# Patient Record
Sex: Female | Born: 1990 | Race: Black or African American | Hispanic: No | Marital: Single
Health system: Southern US, Community
[De-identification: ages and names within clinical notes are randomized; demographics above are authoritative.]

## PROBLEM LIST (undated history)

## (undated) DIAGNOSIS — F32A Depression, unspecified: Secondary | ICD-10-CM

---
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
## 2020-05-18 ENCOUNTER — Emergency Department (HOSPITAL_COMMUNITY)
Admission: EM | Admit: 2020-05-18 | Discharge: 2020-05-18 | Disposition: A | Discharge status: Home / Self Care | Payer: Self-pay | Attending: Emergency Medicine | Admitting: Emergency Medicine

## 2020-05-18 ENCOUNTER — Encounter (HOSPITAL_COMMUNITY): Payer: Self-pay

## 2020-05-18 DIAGNOSIS — R258 Other abnormal involuntary movements: Secondary | ICD-10-CM | POA: Insufficient documentation

## 2020-05-18 DIAGNOSIS — R000 Tachycardia, unspecified: Secondary | ICD-10-CM | POA: Insufficient documentation

## 2020-05-18 DIAGNOSIS — F1010 Alcohol abuse, uncomplicated: Secondary | ICD-10-CM | POA: Insufficient documentation

## 2020-05-18 DIAGNOSIS — R45851 Suicidal ideations: Secondary | ICD-10-CM | POA: Insufficient documentation

## 2020-05-18 DIAGNOSIS — F1290 Cannabis use, unspecified, uncomplicated: Secondary | ICD-10-CM | POA: Insufficient documentation

## 2020-05-18 DIAGNOSIS — Z046 Encounter for general psychiatric examination, requested by authority: Secondary | ICD-10-CM

## 2020-05-18 HISTORY — DX: Depression, unspecified: F32.A

## 2020-05-18 LAB — COMPREHENSIVE METABOLIC PANEL
ALT: 15 U/L (ref 0–44)
AST: 22 U/L (ref 15–41)
Albumin: 4.7 g/dL (ref 3.5–5.0)
Alkaline Phosphatase: 58 U/L (ref 38–126)
Anion gap: 16 — ABNORMAL HIGH (ref 5–15)
BUN: 12 mg/dL (ref 6–20)
CO2: 19 mmol/L — ABNORMAL LOW (ref 22–32)
Calcium: 9.1 mg/dL (ref 8.9–10.3)
Chloride: 106 mmol/L (ref 98–111)
Creatinine, Ser: 0.75 mg/dL (ref 0.44–1.00)
GFR calc Af Amer: >60 mL/min (ref >60)
GFR calc non Af Amer: >60 mL/min (ref >60)
Glucose, Bld: 86 mg/dL (ref 70–99)
Potassium: 3.4 mmol/L — ABNORMAL LOW (ref 3.5–5.1)
Sodium: 141 mmol/L (ref 135–145)
Total Bilirubin: 0.4 mg/dL (ref 0.3–1.2)
Total Protein: 7.4 g/dL (ref 6.5–8.1)

## 2020-05-18 LAB — CBC
HCT: 42.9 % (ref 36.0–46.0)
Hemoglobin: 15.3 g/dL — ABNORMAL HIGH (ref 12.0–15.0)
MCH: 33.8 pg (ref 26.0–34.0)
MCHC: 35.7 g/dL (ref 30.0–36.0)
MCV: 94.9 fL (ref 80.0–100.0)
Platelets: 401 10*3/uL — ABNORMAL HIGH (ref 150–400)
RBC: 4.52 MIL/uL (ref 3.87–5.11)
RDW: 13.6 % (ref 11.5–15.5)
WBC: 8.8 10*3/uL (ref 4.0–10.5)
nRBC: 0 % (ref 0.0–0.2)

## 2020-05-18 LAB — RAPID URINE DRUG SCREEN, HOSP PERFORMED
Amphetamines: NOT DETECTED
Barbiturates: NOT DETECTED
Benzodiazepines: NOT DETECTED
Cocaine: NOT DETECTED
Opiates: NOT DETECTED
Tetrahydrocannabinol: POSITIVE — AB

## 2020-05-18 LAB — ACETAMINOPHEN LEVEL: Acetaminophen (Tylenol), Serum: <10 ug/mL — ABNORMAL LOW (ref 10–30)

## 2020-05-18 LAB — SALICYLATE LEVEL: Salicylate Lvl: <7 mg/dL — ABNORMAL LOW (ref 7.0–30.0)

## 2020-05-18 LAB — ETHANOL: Alcohol, Ethyl (B): 252 mg/dL — ABNORMAL HIGH (ref <10)

## 2020-05-18 LAB — HCG, QUANTITATIVE, PREGNANCY: hCG, Beta Chain, Quant, S: <1 m[IU]/mL (ref <5)

## 2020-05-18 MED ORDER — LORAZEPAM 1 MG PO TABS: 0.0000 mg | ORAL_TABLET | Freq: Two times a day (BID) | ORAL | Status: DC

## 2020-05-18 MED ORDER — LORAZEPAM 2 MG/ML IJ SOLN: 0.0000 mg | Freq: Four times a day (QID) | INTRAMUSCULAR | Status: DC

## 2020-05-18 MED ORDER — THIAMINE HCL 100 MG/ML IJ SOLN: 100.0000 mg | Freq: Every day | INTRAMUSCULAR | Status: DC

## 2020-05-18 MED ORDER — THIAMINE HCL 100 MG PO TABS: 100.0000 mg | ORAL_TABLET | Freq: Every day | ORAL | Status: DC

## 2020-05-18 MED ORDER — LORAZEPAM 1 MG PO TABS: 0.0000 mg | ORAL_TABLET | Freq: Four times a day (QID) | ORAL | Status: DC

## 2020-05-18 MED ORDER — LORAZEPAM 2 MG/ML IJ SOLN: 0.0000 mg | Freq: Two times a day (BID) | INTRAMUSCULAR | Status: DC

## 2020-05-18 MED ORDER — ZIPRASIDONE MESYLATE 20 MG IM SOLR
INTRAMUSCULAR | Status: AC
  Administered 2020-05-18: 20 mg
  Filled 2020-05-18: qty 20

## 2020-05-18 MED ORDER — STERILE WATER FOR INJECTION IJ SOLN
INTRAMUSCULAR | Status: AC
  Administered 2020-05-18: 1.2 mL
  Filled 2020-05-18: qty 10

## 2020-05-18 NOTE — ED Notes (Signed)
CIWA assessment deferred. Pt currently sleeping

## 2020-05-18 NOTE — ED Provider Notes (Signed)
Patient is been medically cleared by behavioral health for discharge home.  IVC will be rescinded.  Follow-up as per behavioral health.   Vanetta Mulders, MD 05/18/20 1334

## 2020-05-18 NOTE — ED Notes (Signed)
Pt in triage, yelling at staff, stating that she is not staying and going home with her mom, pt refusing to cooperate with staff or change into scrubs, PA at bedside to take out IVC paperwork. Pt then attempted to leave after staff and [employer] explaining numerous times she needed to stay to be evaluated. [employer] witnessed numerous threats from pt of suicidal thoughts, witnessed pt attempt to jump out of the window, and heard pt state she would kill "everyone in here" while walking threw the lobby to the bathroom.
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
## 2020-05-18 NOTE — ED Notes (Signed)
Patient belongs are locker # 4 has been inventory
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
## 2020-05-18 NOTE — ED Notes (Signed)
[facility] clinician at bedside assessing pt.

## 2020-05-18 NOTE — Discharge Instructions (Addendum)
You have been medically cleared by behavioral health for discharge home.  Return for any new or worse symptoms.
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
## 2020-05-18 NOTE — ED Notes (Signed)
CIWA assessment deferred pt currently sleeping.
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
## 2020-05-18 NOTE — BH Assessment (Signed)
[facility] Assessment Progress Note  Case was staffed with Rankin NP who recommended patient will be discharged this date and IVC be rescinded.

## 2021-03-10 ENCOUNTER — Emergency Department (HOSPITAL_COMMUNITY): Admission: EM | Admit: 2021-03-10 | Discharge: 2021-03-10 | Payer: Self-pay

## 2021-03-10 ENCOUNTER — Other Ambulatory Visit: Payer: Self-pay
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
